# Patient Record
Sex: Male | Born: 1965 | Race: Black or African American | Hispanic: No | Marital: Single | Smoking: Never smoker
Health system: Southern US, Community
[De-identification: ages and names within clinical notes are randomized; demographics above are authoritative.]

## PROBLEM LIST (undated history)

## (undated) DIAGNOSIS — I10 Essential (primary) hypertension: Secondary | ICD-10-CM

---

## 2001-03-05 ENCOUNTER — Emergency Department (HOSPITAL_COMMUNITY): Admission: EM | Admit: 2001-03-05 | Discharge: 2001-03-05 | Payer: Self-pay | Admitting: *Deleted

## 2019-03-21 ENCOUNTER — Other Ambulatory Visit: Payer: Self-pay

## 2019-03-21 DIAGNOSIS — Z20822 Contact with and (suspected) exposure to covid-19: Secondary | ICD-10-CM

## 2019-03-24 LAB — NOVEL CORONAVIRUS, NAA: SARS-CoV-2, NAA: NOT DETECTED

## 2019-03-29 ENCOUNTER — Telehealth: Payer: Self-pay | Admitting: *Deleted

## 2019-03-29 NOTE — Telephone Encounter (Signed)
Patient called for result- negative- patient just did test for screening. Patient advised to notify PCP for changes in health status/retest for symptoms

## 2020-04-09 ENCOUNTER — Ambulatory Visit: Payer: Self-pay | Attending: Internal Medicine

## 2020-04-09 DIAGNOSIS — Z23 Encounter for immunization: Secondary | ICD-10-CM

## 2020-04-09 NOTE — Progress Notes (Signed)
   Covid-19 Vaccination Clinic  Name:  Bob Alexander    MRN: [id number] DOB: 23-Sep-1965  04/09/2020  Mr. Bob Alexander was observed post Covid-19 immunization for 15 minutes without incident. He was provided with Vaccine Information Sheet and instruction to access the V-Safe system.   Mr. Bob Alexander was instructed to call 911 with any severe reactions post vaccine: Marland Kitchen Difficulty breathing  . Swelling of face and throat  . A fast heartbeat  . A bad rash all over body  . Dizziness and weakness   Immunizations Administered    Name Date Dose VIS Date Route   Pfizer COVID-19 Vaccine 04/09/2020  9:34 AM 0.3 mL 10/23/2018 Intramuscular   Manufacturer: [employer], [employer]   Lot: [id number]   NDC: [address]-1

## 2020-04-30 ENCOUNTER — Ambulatory Visit: Payer: Self-pay | Attending: Internal Medicine

## 2020-04-30 DIAGNOSIS — Z23 Encounter for immunization: Secondary | ICD-10-CM

## 2020-04-30 NOTE — Progress Notes (Signed)
   Covid-19 Vaccination Clinic  Name:  Bob Alexander    MRN: [id number] DOB: Mar 30, 1966  04/30/2020  [name] was observed post Covid-19 immunization for 15 minutes without incident. He was provided with Vaccine Information Sheet and instruction to access the V-Safe system.   [name] was instructed to call 911 with any severe reactions post vaccine: Marland Kitchen Difficulty breathing  . Swelling of face and throat  . A fast heartbeat  . A bad rash all over body  . Dizziness and weakness   Immunizations Administered    Name Date Dose VIS Date Route   Pfizer COVID-19 Vaccine 04/30/2020 11:10 AM 0.3 mL 10/23/2018 Intramuscular   Manufacturer: [employer], [employer]   Lot: [id number]   NDC: [address]-3

## 2020-07-01 ENCOUNTER — Other Ambulatory Visit: Payer: Self-pay

## 2020-07-01 DIAGNOSIS — Z20822 Contact with and (suspected) exposure to covid-19: Secondary | ICD-10-CM

## 2020-07-03 LAB — SARS-COV-2, NAA 2 DAY TAT

## 2020-07-03 LAB — NOVEL CORONAVIRUS, NAA: SARS-CoV-2, NAA: NOT DETECTED

## 2022-01-31 ENCOUNTER — Other Ambulatory Visit: Payer: Self-pay | Admitting: Physician Assistant

## 2022-01-31 ENCOUNTER — Other Ambulatory Visit (HOSPITAL_COMMUNITY): Payer: Self-pay | Admitting: Physician Assistant

## 2022-01-31 DIAGNOSIS — Z122 Encounter for screening for malignant neoplasm of respiratory organs: Secondary | ICD-10-CM

## 2022-01-31 DIAGNOSIS — F17200 Nicotine dependence, unspecified, uncomplicated: Secondary | ICD-10-CM

## 2022-02-01 ENCOUNTER — Encounter: Payer: Self-pay | Admitting: *Deleted

## 2022-02-11 ENCOUNTER — Ambulatory Visit (HOSPITAL_COMMUNITY)
Admission: RE | Admit: 2022-02-11 | Discharge: 2022-02-11 | Disposition: A | Discharge status: Home / Self Care | Payer: Self-pay | Source: Ambulatory Visit | Attending: Physician Assistant | Admitting: Physician Assistant

## 2022-02-11 DIAGNOSIS — F17200 Nicotine dependence, unspecified, uncomplicated: Secondary | ICD-10-CM

## 2022-02-11 DIAGNOSIS — Z122 Encounter for screening for malignant neoplasm of respiratory organs: Secondary | ICD-10-CM

## 2022-02-28 ENCOUNTER — Encounter (HOSPITAL_COMMUNITY): Payer: Self-pay

## 2022-02-28 ENCOUNTER — Other Ambulatory Visit: Payer: Self-pay

## 2022-02-28 ENCOUNTER — Emergency Department (HOSPITAL_COMMUNITY)
Admission: EM | Admit: 2022-02-28 | Discharge: 2022-02-28 | Disposition: A | Discharge status: Home / Self Care | Payer: 59 | Attending: Emergency Medicine | Admitting: Emergency Medicine

## 2022-02-28 DIAGNOSIS — K0889 Other specified disorders of teeth and supporting structures: Secondary | ICD-10-CM | POA: Diagnosis present

## 2022-02-28 DIAGNOSIS — K029 Dental caries, unspecified: Secondary | ICD-10-CM | POA: Insufficient documentation

## 2022-02-28 HISTORY — DX: Essential (primary) hypertension: I10

## 2022-02-28 MED ORDER — PENICILLIN V POTASSIUM 500 MG PO TABS: 500.0000 mg | ORAL_TABLET | Freq: Four times a day (QID) | ORAL | 0 refills | Status: AC

## 2022-02-28 MED ORDER — PENICILLIN V POTASSIUM 250 MG PO TABS
500.0000 mg | ORAL_TABLET | Freq: Once | ORAL | Status: AC
  Administered 2022-02-28: 500 mg via ORAL
  Filled 2022-02-28: qty 2

## 2022-02-28 NOTE — ED Triage Notes (Signed)
Patient reports two teeth in mouth causing pain. States the feels like they are infected. States that he has appointment to see dentist in August.

## 2022-02-28 NOTE — ED Provider Notes (Signed)
  [facility] EMERGENCY DEPARTMENT Provider Note   CSN: [id number] Arrival date & time: 02/28/22  4782     History  Chief Complaint  Patient presents with   Dental Pain    Bob Alexander is a 56 y.o. male.  The history is provided by the patient.  Dental Pain Location:  Upper and lower Quality:  Aching Severity:  Moderate Onset quality:  Gradual Timing:  Constant Progression:  Worsening Chronicity:  Recurrent Associated symptoms: no fever   Patient is had dental pain for quite some time, now worsening.  He is unable to see his dentist until next month     Home Medications Prior to Admission medications   Medication Sig Start Date End Date Taking? Authorizing Provider  penicillin v potassium (VEETID) 500 MG tablet Take 1 tablet (500 mg total) by mouth 4 (four) times daily for 7 days. 02/28/22 03/07/22 Yes Zadie Rhine, MD      Allergies    Patient has no known allergies.    Review of Systems   Review of Systems  Constitutional:  Negative for fever.    Physical Exam Updated Vital Signs BP (!) 148/101   Pulse (!) 50   Temp 97.6 F (36.4 C) (Oral)   Resp 18   Ht 1.676 m (5\' 6" )   Wt 54.9 kg   SpO2 100%   BMI 19.53 kg/m  Physical Exam CONSTITUTIONAL: Well developed/well nourished HEAD AND FACE: Normocephalic/atraumatic EYES: EOMI/PERRL ENMT: Mucous membranes moist.  Poor dentition.  No trismus.  No focal abscess noted. NECK: supple no meningeal signs NEURO: Pt is awake/alert, moves all extremitiesx4 EXTREMITIES:full ROM SKIN: warm, color normal  ED Results / Procedures / Treatments   Labs (all labs ordered are listed, but only abnormal results are displayed) Labs Reviewed - No data to display  EKG None  Radiology No results found.  Procedures Procedures    Medications Ordered in ED Medications  penicillin v potassium (VEETID) tablet 500 mg (has no administration in time range)    ED Course/ Medical Decision Making/ A&P                            Medical Decision Making Risk Prescription drug management.   Patient already has ibuprofen prescribed.  We will add on penicillin.  He already has dentistry follow-up.  No other acute complaints        Final Clinical Impression(s) / ED Diagnoses Final diagnoses:  Dental caries    Rx / DC Orders ED Discharge Orders          Ordered    penicillin v potassium (VEETID) 500 MG tablet  4 times daily        02/28/22 0711              05/01/22, MD 02/28/22 [phone]

## 2022-03-23 ENCOUNTER — Other Ambulatory Visit: Payer: Self-pay

## 2022-03-23 ENCOUNTER — Emergency Department (HOSPITAL_COMMUNITY)
Admission: EM | Admit: 2022-03-23 | Discharge: 2022-03-23 | Disposition: A | Discharge status: Home / Self Care | Payer: 59 | Attending: Emergency Medicine | Admitting: Emergency Medicine

## 2022-03-23 ENCOUNTER — Emergency Department (HOSPITAL_COMMUNITY): Payer: 59

## 2022-03-23 ENCOUNTER — Encounter (HOSPITAL_COMMUNITY): Payer: Self-pay

## 2022-03-23 DIAGNOSIS — Z79899 Other long term (current) drug therapy: Secondary | ICD-10-CM | POA: Insufficient documentation

## 2022-03-23 DIAGNOSIS — R0789 Other chest pain: Secondary | ICD-10-CM

## 2022-03-23 DIAGNOSIS — I10 Essential (primary) hypertension: Secondary | ICD-10-CM | POA: Insufficient documentation

## 2022-03-23 DIAGNOSIS — R079 Chest pain, unspecified: Secondary | ICD-10-CM | POA: Diagnosis present

## 2022-03-23 LAB — BASIC METABOLIC PANEL
Anion gap: 7 (ref 5–15)
BUN: 15 mg/dL (ref 6–20)
CO2: 28 mmol/L (ref 22–32)
Calcium: 9 mg/dL (ref 8.9–10.3)
Chloride: 110 mmol/L (ref 98–111)
Creatinine, Ser: 1.25 mg/dL — ABNORMAL HIGH (ref 0.61–1.24)
GFR, Estimated: >60 mL/min (ref >60)
Glucose, Bld: 75 mg/dL (ref 70–99)
Potassium: 3.7 mmol/L (ref 3.5–5.1)
Sodium: 145 mmol/L (ref 135–145)

## 2022-03-23 LAB — HEPATIC FUNCTION PANEL
ALT: 24 U/L (ref 0–44)
AST: 24 U/L (ref 15–41)
Albumin: 4 g/dL (ref 3.5–5.0)
Alkaline Phosphatase: 60 U/L (ref 38–126)
Bilirubin, Direct: 0.1 mg/dL (ref 0.0–0.2)
Indirect Bilirubin: 0.3 mg/dL (ref 0.3–0.9)
Total Bilirubin: 0.4 mg/dL (ref 0.3–1.2)
Total Protein: 7.1 g/dL (ref 6.5–8.1)

## 2022-03-23 LAB — TROPONIN I (HIGH SENSITIVITY)
Troponin I (High Sensitivity): 3 ng/L (ref <18)
Troponin I (High Sensitivity): 3 ng/L (ref <18)

## 2022-03-23 LAB — CBC
HCT: 39.9 % (ref 39.0–52.0)
Hemoglobin: 13.2 g/dL (ref 13.0–17.0)
MCH: 27.6 pg (ref 26.0–34.0)
MCHC: 33.1 g/dL (ref 30.0–36.0)
MCV: 83.3 fL (ref 80.0–100.0)
Platelets: 163 10*3/uL (ref 150–400)
RBC: 4.79 MIL/uL (ref 4.22–5.81)
RDW: 14.7 % (ref 11.5–15.5)
WBC: 5.7 10*3/uL (ref 4.0–10.5)
nRBC: 0 % (ref 0.0–0.2)

## 2022-03-23 NOTE — ED Triage Notes (Addendum)
Patient with complaints of chest pain that started this morning that comes and goes. Pain is worse with movement of left arm. Currently denies chest pain at present.

## 2022-03-23 NOTE — Discharge Instructions (Signed)
Take Tylenol or Motrin for pain.  We have referred you to a cardiologist to follow-up with at some point and they can decide whether stress test necessary

## 2022-03-23 NOTE — ED Provider Notes (Signed)
[facility] EMERGENCY DEPARTMENT Provider Note   CSN: [id number] Arrival date & time: 03/23/22  0915     History {Add pertinent medical, surgical, social history, OB history to HPI:1} Chief Complaint  Patient presents with   Chest Pain    Bob Alexander is a 56 y.o. male.  Patient has a history of hypertension.  He had some left-sided chest pain today.  No pain now   Chest Pain      Home Medications Prior to Admission medications   Medication Sig Start Date End Date Taking? Authorizing Provider  olmesartan (BENICAR) 20 MG tablet Take 20 mg by mouth daily. 03/15/22  Yes [provider]  penicillin v potassium (VEETID) 500 MG tablet Take 500 mg by mouth 4 (four) times daily.   Yes [provider]  ibuprofen (ADVIL) 800 MG tablet Take 800 mg by mouth every 8 (eight) hours as needed for fever or mild pain. Patient not taking: Reported on 03/23/2022    [provider]      Allergies    Patient has no known allergies.    Review of Systems   Review of Systems  Cardiovascular:  Positive for chest pain.    Physical Exam Updated Vital Signs BP (!) 146/91 (BP Location: Left Arm)   Pulse (!) 54   Temp 98.2 F (36.8 C) (Oral)   Resp 15   Ht 5\' 6"  (1.676 m)   Wt 54.4 kg   SpO2 100%   BMI 19.37 kg/m  Physical Exam  ED Results / Procedures / Treatments   Labs (all labs ordered are listed, but only abnormal results are displayed) Labs Reviewed  BASIC METABOLIC PANEL - Abnormal; Notable for the following components:      Result Value   Creatinine, Ser 1.25 (*)    All other components within normal limits  CBC  HEPATIC FUNCTION PANEL  TROPONIN I (HIGH SENSITIVITY)  TROPONIN I (HIGH SENSITIVITY)    EKG EKG Interpretation  Date/Time:  Wednesday March 23 2022 09:56:32 EDT Ventricular Rate:  57 PR Interval:  130 QRS Duration: 72 QT Interval:  402 QTC Calculation: 391 R Axis:   73 Text Interpretation: Sinus bradycardia Septal infarct ,  age undetermined Abnormal ECG No previous ECGs available Confirmed by 08-02-1980 [phone]) on 03/23/2022 10:37:11 AM  Radiology DG Chest 2 View  Result Date: 03/23/2022 CLINICAL DATA:  Chest pain. EXAM: CHEST - 2 VIEW COMPARISON:  Chest CT 02/11/2022 FINDINGS: The cardiac silhouette, mediastinal and hilar contours are normal. The lungs demonstrate mild emphysematous changes and hyperinflation but no infiltrates, edema or effusions. The bony thorax is intact. IMPRESSION: Mild emphysematous changes but no acute pulmonary findings. Electronically Signed   By: 02/13/2022 M.D.   On: 03/23/2022 10:22    Procedures Procedures  {Document cardiac monitor, telemetry assessment procedure when appropriate:1}  Medications Ordered in ED Medications - No data to display  ED Course/ Medical Decision Making/ A&P                           Medical Decision Making Amount and/or Complexity of Data Reviewed Labs: ordered. Radiology: ordered.   Patient with left sided chest pain that has resolved.  Troponins negative.  He is referred to cardiology and will be discharged  {Document critical care time when appropriate:1} {Document review of labs and clinical decision tools ie heart score, Chads2Vasc2 etc:1}  {Document your independent review of radiology images, and any outside records:1} {  Document your discussion with family members, caretakers, and with consultants:1} {Document social determinants of health affecting pt's care:1} {Document your decision making why or why not admission, treatments were needed:1} Final Clinical Impression(s) / ED Diagnoses Final diagnoses:  Atypical chest pain    Rx / DC Orders ED Discharge Orders     None

## 2022-08-02 ENCOUNTER — Encounter: Payer: Self-pay | Admitting: *Deleted

## 2023-06-28 IMAGING — CT CT CHEST W/O CM
2 of 4 series · 15 of 36 positions shown, 18 images · non-contrast
Comparison: None Available.

CLINICAL DATA: Smoker



[Series 2: routine chest without · axial · non-contrast · 0.69mm/px · z∈[+1158,+1438]mm · 12 of 166 slices shown, 15 images]
[im 13/166  mediastinal]
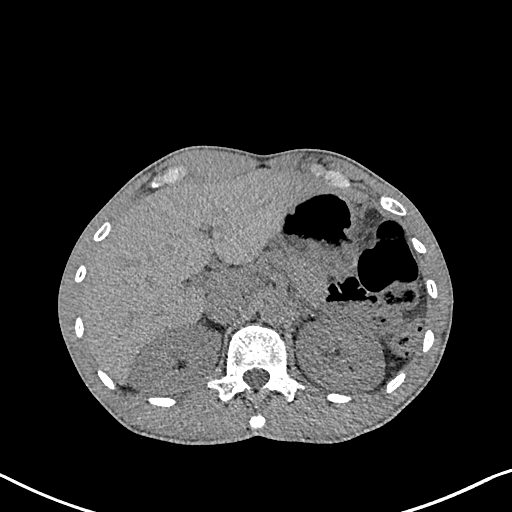
[im 13/166  lung]
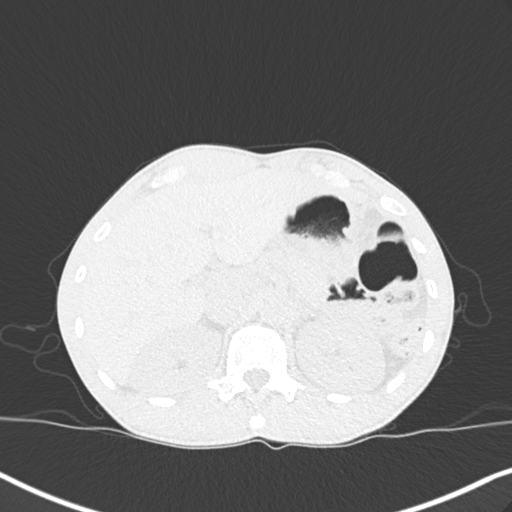
[im 26/166  lung]
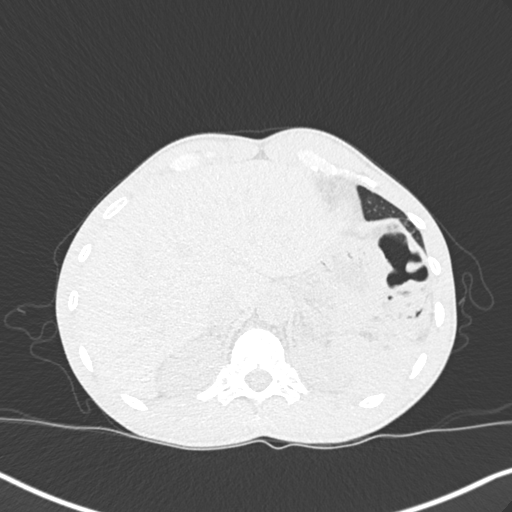
[im 39/166  lung]
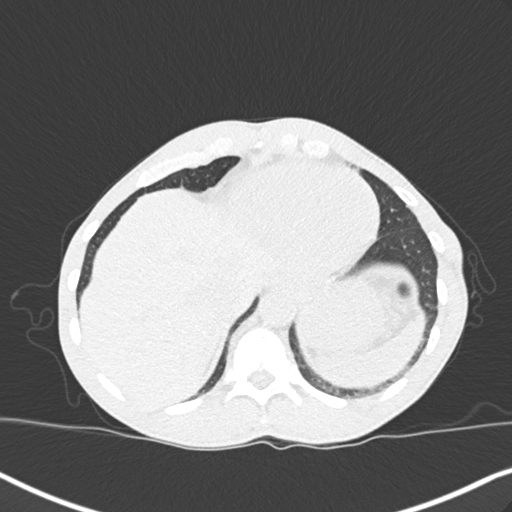
[im 51/166  lung]
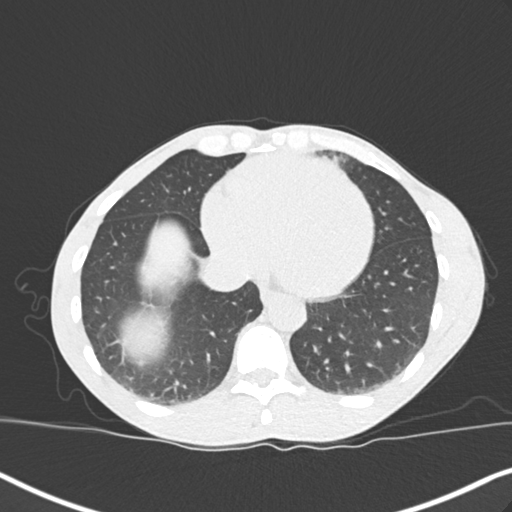
[im 64/166  mediastinal]
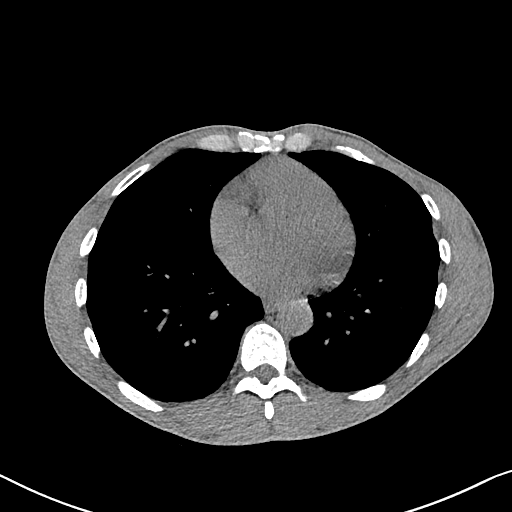
[im 64/166  lung]
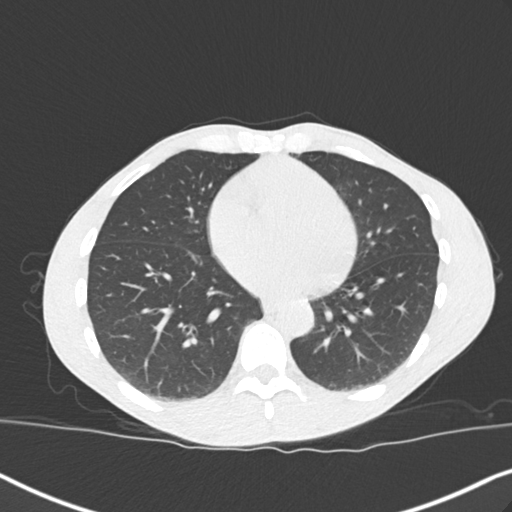
[im 77/166  lung]
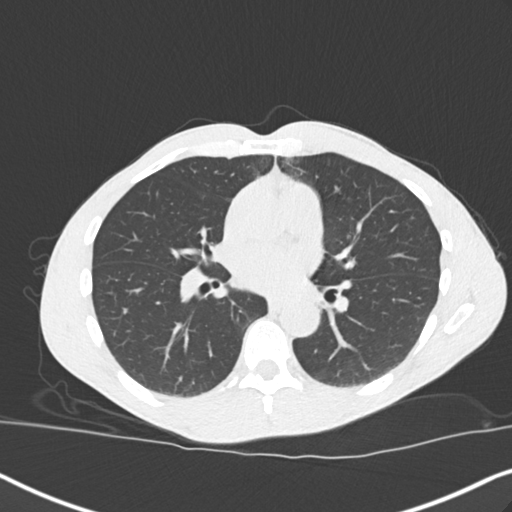
[im 89/166  lung]
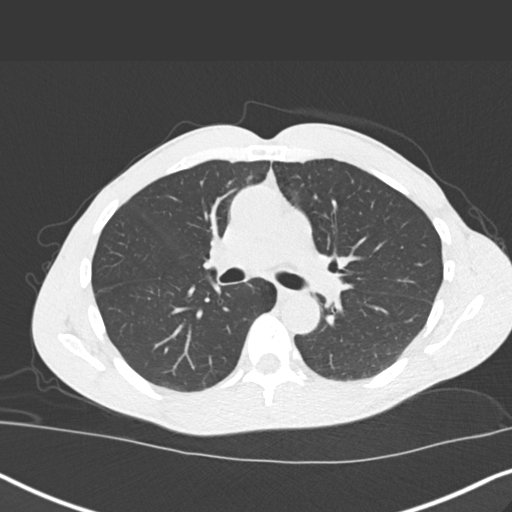
[im 102/166  lung]
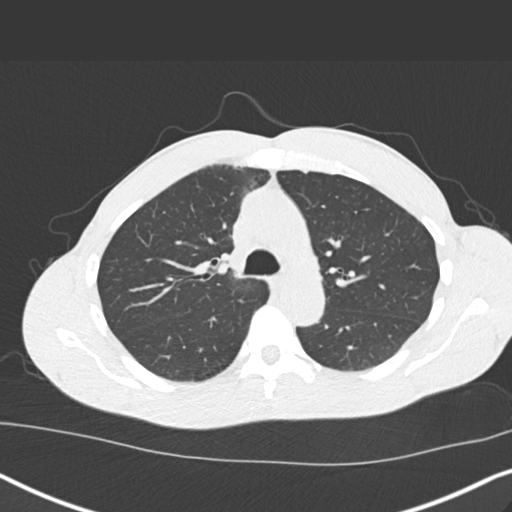
[im 115/166  mediastinal]
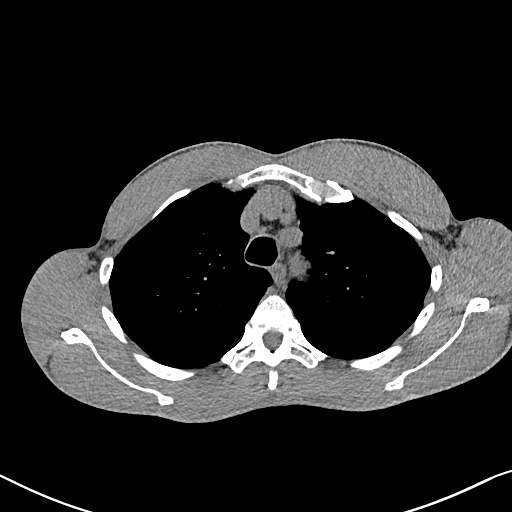
[im 115/166  lung]
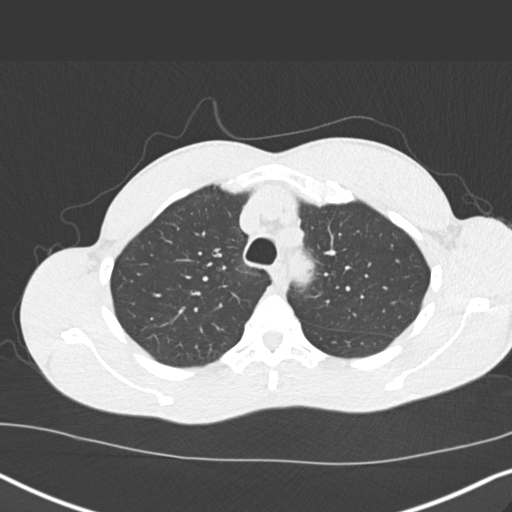
[im 127/166  lung]
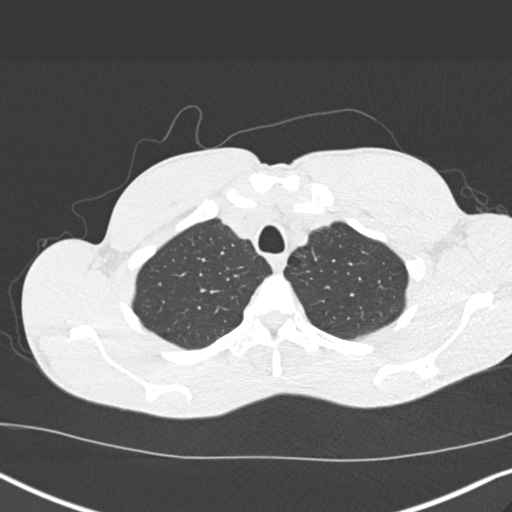
[im 140/166  lung]
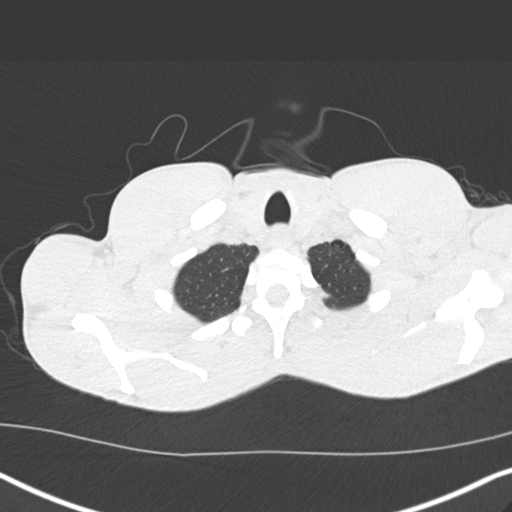
[im 153/166  lung]
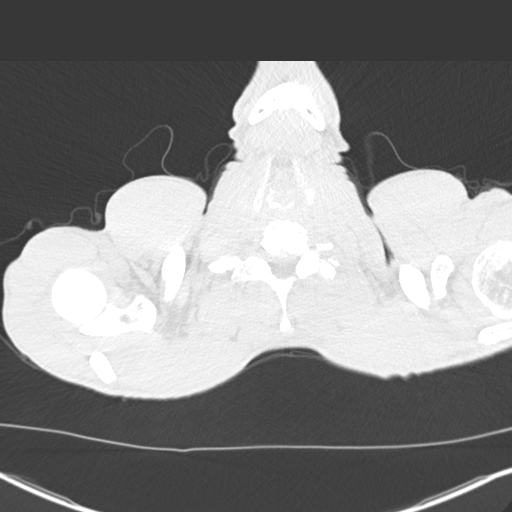

[Series 5: coronal · coronal · 0.68mm/px · 3 of 123 slices shown]
[im 25/123  lung]
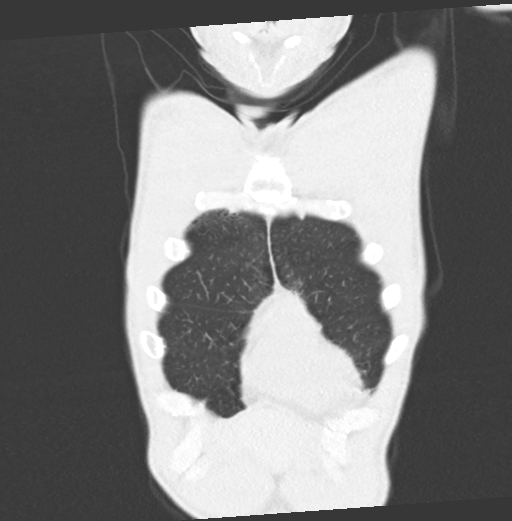
[im 49/123  lung]
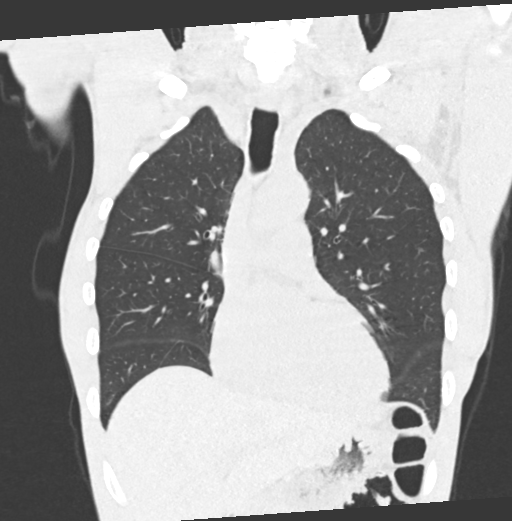
[im 74/123  lung]
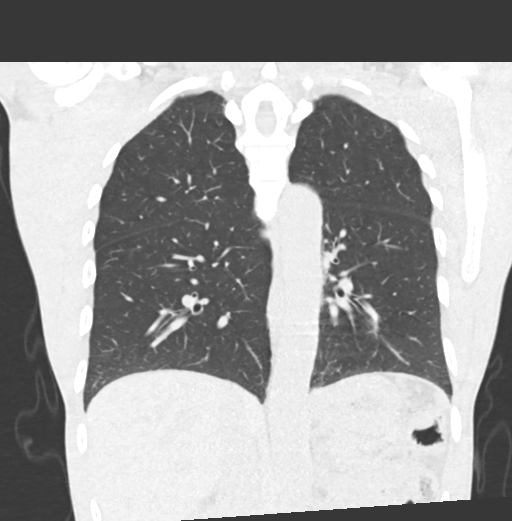

[15 of 36 positions shown; findings below may reference images not displayed]

FINDINGS: Cardiovascular: No significant vascular findings. Normal heart size.
No pericardial effusion.

Mediastinum/Nodes: No enlarged mediastinal, hilar, or axillary lymph
nodes. Thyroid gland, trachea, and esophagus demonstrate no
significant findings.

Lungs/Pleura: Mild paraseptal emphysema. Background of fine
centrilobular nodularity, most concentrated in the lung apices. No
pleural effusion or pneumothorax.

Upper Abdomen: No acute abnormality.

Musculoskeletal: No chest wall abnormality. No suspicious osseous
lesions identified.
IMPRESSION: 1. No suspicious pulmonary nodules.
2. Mild emphysema.
3. Background of fine centrilobular nodularity, most concentrated in
the lung apices, consistent with smoking-related respiratory
bronchiolitis.

Emphysema ([id number].2).
